# Patient Record
Sex: Male | Born: 1990 | Race: White | Hispanic: No | Marital: Single | Smoking: Current every day smoker
Health system: Southern US, Community
[De-identification: ages and names within clinical notes are randomized; demographics above are authoritative.]

## PROBLEM LIST (undated history)

## (undated) HISTORY — PX: MYRINGOTOMY: SUR874

## (undated) HISTORY — PX: EYE SURGERY: SHX253

---
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
## 2009-07-29 ENCOUNTER — Encounter (HOSPITAL_COMMUNITY)
Admission: RE | Admit: 2009-07-29 | Discharge: 2009-10-06 | Payer: Self-pay | Source: Home / Self Care | Admitting: Endocrinology

## 2009-08-09 ENCOUNTER — Encounter: Admission: RE | Admit: 2009-08-09 | Discharge: 2009-08-09 | Payer: Self-pay | Admitting: Endocrinology

## 2009-08-31 ENCOUNTER — Encounter: Admission: RE | Admit: 2009-08-31 | Discharge: 2009-08-31 | Payer: Self-pay | Admitting: Endocrinology

## 2009-08-31 ENCOUNTER — Other Ambulatory Visit: Admission: RE | Admit: 2009-08-31 | Discharge: 2009-08-31 | Payer: Self-pay | Admitting: Interventional Radiology

## 2010-05-19 ENCOUNTER — Emergency Department (HOSPITAL_COMMUNITY)
Admission: EM | Admit: 2010-05-19 | Discharge: 2010-05-19 | Payer: Self-pay | Source: Home / Self Care | Admitting: Emergency Medicine

## 2011-04-14 ENCOUNTER — Other Ambulatory Visit: Payer: Self-pay | Admitting: Endocrinology

## 2011-04-14 DIAGNOSIS — E041 Nontoxic single thyroid nodule: Secondary | ICD-10-CM

## 2011-04-26 ENCOUNTER — Ambulatory Visit
Admission: RE | Admit: 2011-04-26 | Discharge: 2011-04-26 | Disposition: A | Discharge status: Home / Self Care | Payer: BC Managed Care – PPO | Source: Ambulatory Visit | Attending: Endocrinology | Admitting: Endocrinology

## 2011-04-26 DIAGNOSIS — E041 Nontoxic single thyroid nodule: Secondary | ICD-10-CM

## 2011-09-14 ENCOUNTER — Emergency Department (HOSPITAL_COMMUNITY)
Admission: EM | Admit: 2011-09-14 | Discharge: 2011-09-14 | Disposition: A | Discharge status: Home / Self Care | Payer: BC Managed Care – PPO | Attending: Emergency Medicine | Admitting: Emergency Medicine

## 2011-09-14 ENCOUNTER — Encounter (HOSPITAL_COMMUNITY): Payer: Self-pay | Admitting: Emergency Medicine

## 2011-09-14 DIAGNOSIS — S61209A Unspecified open wound of unspecified finger without damage to nail, initial encounter: Secondary | ICD-10-CM | POA: Insufficient documentation

## 2011-09-14 DIAGNOSIS — M79609 Pain in unspecified limb: Secondary | ICD-10-CM | POA: Insufficient documentation

## 2011-09-14 DIAGNOSIS — W278XXA Contact with other nonpowered hand tool, initial encounter: Secondary | ICD-10-CM | POA: Insufficient documentation

## 2011-09-14 MED ORDER — TETANUS-DIPHTH-ACELL PERTUSSIS 5-2.5-18.5 LF-MCG/0.5 IM SUSP
0.5000 mL | Freq: Once | INTRAMUSCULAR | Status: AC
  Administered 2011-09-14: 0.5 mL via INTRAMUSCULAR

## 2011-09-14 MED ORDER — LIDOCAINE HCL (PF) 2 % IJ SOLN
INTRAMUSCULAR | Status: AC
  Administered 2011-09-14: 18:00:00
  Filled 2011-09-14: qty 10

## 2011-09-14 MED ORDER — TETANUS-DIPHTH-ACELL PERTUSSIS 5-2.5-18.5 LF-MCG/0.5 IM SUSP
INTRAMUSCULAR | Status: AC
  Filled 2011-09-14: qty 0.5

## 2011-09-14 NOTE — ED Notes (Signed)
Patient with c/o right first finger laceration. Patient reports cutting finger on vegetable slicer. Bleeding controlled. Laceration to tip of right first finger. Re-dressed in triage.

## 2011-09-14 NOTE — Discharge Instructions (Signed)
Finger Avulsion  When the tip of the finger is lost, a new nail may grow back if part of the fingernail is left. The new nail may be deformed. If just the tip of the finger is lost, no repair may be needed unless there is bone showing. If bone is showing, your caregiver may need to remove the protruding bone and put on a bandage. Your caregiver will do what is best for you. Most of the time when a fingertip is lost, the end will gradually grow back on and look fairly normal, but it may remain sensitive to pressure and temperature extremes for a long time. HOME CARE INSTRUCTIONS   Keep your hand elevated above your heart to relieve pain and swelling.   Keep your dressing dry and clean.   Change your bandage in 24 hours or as directed.   After your bandage is changed, soak your hand in warm soapy water for 10 to 15 minutes. Do this 3 times per day. This helps reduce pain and swelling.   After soaking your hand, apply a clean, dry bandage. Change your bandage if it is wet or dirty.   Only take over-the-counter or prescription medicines for pain, discomfort, or fever as directed by your caregiver.   See your caregiver as needed for problems.  SEEK MEDICAL CARE IF:   You have increased pain, swelling, drainage, or bleeding.   You have a fever.   You have swelling that spreads from your finger and into your hand.  Make sure to check to see if you need a tetanus booster. Document Released: 07/03/2001 Document Revised: 04/13/2011 Document Reviewed: 05/28/2008 Exit[employer] Patient Information 2012 [employer]. 

## 2011-09-14 NOTE — ED Notes (Signed)
Pt cut tip of right middle finger with a vegetable slicer today

## 2011-09-14 NOTE — ED Provider Notes (Signed)
History     CSN: [id number]  Arrival date & time 09/14/11  1714   First MD Initiated Contact with Patient 09/14/11 1720      Chief Complaint  Patient presents with  . Laceration    (Consider location/radiation/quality/duration/timing/severity/associated sxs/prior treatment) Patient is a 21 y.o. male presenting with skin laceration. The history is provided by the patient.  Laceration  The incident occurred less than 1 hour ago. The laceration is located on the right hand. Size: 0.5 cm diameter round avulsion to distal right third finger,  hemostatic. Injury mechanism: a vegetable slicer. The pain is at a severity of 4/10. The pain is moderate. The pain has been constant since onset. He reports no foreign bodies present. His tetanus status is out of date.    History reviewed. No pertinent past medical history.  Past Surgical History  Procedure Date  . Myringotomy     No family history on file.  History  Substance Use Topics  . Smoking status: Current Everyday Smoker -- 0.5 packs/day  . Smokeless tobacco: Not on file  . Alcohol Use: Yes     occasionally      Review of Systems  Constitutional: Negative for fever.  Musculoskeletal: Negative for joint swelling and arthralgias.  Skin: Positive for wound. Negative for rash.    Allergies  Review of patient's allergies indicates no known allergies.  Home Medications   Raspberry ketones otc diet supplement.    BP 117/75  Pulse 102  Temp(Src) 98.8 F (37.1 C) (Oral)  Resp 18  Ht 5\' 6"  (1.676 m)  Wt 200 lb (90.719 kg)  BMI 32.28 kg/m2  SpO2 99%  Physical Exam  Nursing note and vitals reviewed. Constitutional: He appears well-developed and well-nourished.  HENT:  Head: Normocephalic and atraumatic.  Cardiovascular: Normal rate.   Pulmonary/Chest: Effort normal.  Musculoskeletal: Normal range of motion.  Neurological: He is alert.  Skin: Skin is dry.       0.5 cm superficial avulsion to 3rd finger distal,   Right,  Hemostatic,  Nail plate not affected by injury.  Psychiatric: He has a normal mood and affect.    ED Course  Procedures (including critical care time)  Labs Reviewed - No data to display No results found.   1. Avulsion of skin of finger     Patient was given a digital block using 2% lidocaine without epi,  Using 3 cc after cleansing skin with wound cleaning spray.  Avulsion was then gently scrubbed using wound cleanser spray,  Saline,  Then quick clot was applied for hemostasis.  Wound was then dressed using xeroform and sterile bulky dressing.   Pt tolerated well.   MDM  Tetanus updated.  Pt advised to keep wound clean and dry for the next 48 hours,  Then wash with mild soap and water twice daily,  Keep covered and clean.  REturn here for any sign of infection.        Burgess Amor, PA 09/15/11 1258

## 2011-09-18 NOTE — ED Provider Notes (Signed)
Medical screening examination/treatment/procedure(s) were performed by non-physician practitioner and as supervising physician I was immediately available for consultation/collaboration.  Shelda Jakes, MD 09/18/11 [phone]
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
## 2012-01-26 ENCOUNTER — Other Ambulatory Visit (HOSPITAL_COMMUNITY): Payer: Self-pay | Admitting: Endocrinology

## 2012-01-26 DIAGNOSIS — E049 Nontoxic goiter, unspecified: Secondary | ICD-10-CM

## 2012-04-01 ENCOUNTER — Ambulatory Visit (HOSPITAL_COMMUNITY): Payer: BC Managed Care – PPO

## 2012-04-08 ENCOUNTER — Ambulatory Visit (HOSPITAL_COMMUNITY): Payer: BC Managed Care – PPO | Attending: Endocrinology

## 2013-12-31 ENCOUNTER — Encounter (INDEPENDENT_AMBULATORY_CARE_PROVIDER_SITE_OTHER): Payer: Self-pay | Admitting: *Deleted

## 2014-02-05 ENCOUNTER — Ambulatory Visit (INDEPENDENT_AMBULATORY_CARE_PROVIDER_SITE_OTHER): Payer: Self-pay | Admitting: Internal Medicine

## 2020-06-14 ENCOUNTER — Ambulatory Visit
Admission: EM | Admit: 2020-06-14 | Discharge: 2020-06-14 | Disposition: A | Discharge status: Home / Self Care | Payer: 59 | Attending: Emergency Medicine | Admitting: Emergency Medicine

## 2020-06-14 ENCOUNTER — Other Ambulatory Visit: Payer: Self-pay

## 2020-06-14 ENCOUNTER — Ambulatory Visit (INDEPENDENT_AMBULATORY_CARE_PROVIDER_SITE_OTHER): Payer: 59

## 2020-06-14 DIAGNOSIS — M79642 Pain in left hand: Secondary | ICD-10-CM | POA: Diagnosis not present

## 2020-06-14 DIAGNOSIS — M940 Chondrocostal junction syndrome [Tietze]: Secondary | ICD-10-CM | POA: Diagnosis not present

## 2020-06-14 DIAGNOSIS — R0789 Other chest pain: Secondary | ICD-10-CM | POA: Diagnosis not present

## 2020-06-14 MED ORDER — CYCLOBENZAPRINE HCL 10 MG PO TABS: 10.0000 mg | ORAL_TABLET | Freq: Every day | ORAL | 0 refills | Status: DC

## 2020-06-14 MED ORDER — NAPROXEN 500 MG PO TABS: 500.0000 mg | ORAL_TABLET | Freq: Two times a day (BID) | ORAL | 0 refills | Status: DC
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
## 2020-06-14 NOTE — Discharge Instructions (Addendum)
Rest, ice and heat as needed Ensure adequate range of motion as tolerated. Injuries all appear to be muscular in nature at this time Prescribed naproxen as needed for inflammation and pain relief.  DO NOT TAKE WITH OTHER antiinflammatories, as this may cause GI upset and/or bleed Prescribed flexeril as needed at bedtime for muscle spasm.  Do not drive or operate heavy machinery while taking this medication Expect some increased pain in the next 1-3 days.  It may take 3-4 weeks for complete resolution of symptoms Will f/u with his doctor or here if not seeing significant improvement within one week. Return here or go to ER if you have any new or worsening symptoms such as numbness/tingling of the inner thighs, loss of bladder or bowel control, headache/blurry vision, nausea/vomiting, confusion/altered mental status, dizziness, weakness, passing out, imbalance, etc... 
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
## 2020-06-24 ENCOUNTER — Other Ambulatory Visit: Payer: Self-pay

## 2020-06-24 ENCOUNTER — Encounter: Payer: Self-pay | Admitting: Internal Medicine

## 2020-06-24 ENCOUNTER — Ambulatory Visit: Payer: 59 | Admitting: Internal Medicine

## 2020-06-24 VITALS — BP 131/83 | HR 89 | Temp 98.8°F | Resp 18 | Ht 66.0 in | Wt 215.0 lb

## 2020-06-24 DIAGNOSIS — Z720 Tobacco use: Secondary | ICD-10-CM

## 2020-06-24 DIAGNOSIS — Z7689 Persons encountering health services in other specified circumstances: Secondary | ICD-10-CM

## 2020-06-24 DIAGNOSIS — M549 Dorsalgia, unspecified: Secondary | ICD-10-CM | POA: Diagnosis not present

## 2020-06-24 NOTE — Patient Instructions (Signed)
Please continue to take Flexeril as prescribed for muscle spasms. Okay to take Naproxen or Tylenol for back pain.  Please perform simple back exercises as instructed.  Please follow low salt diet and perform moderate exercise/walking at least 150 mins/week.

## 2020-06-26 LAB — CMP14+EGFR
ALT: 50 IU/L — ABNORMAL HIGH (ref 0–44)
AST: 30 IU/L (ref 0–40)
Albumin/Globulin Ratio: 2.2 (ref 1.2–2.2)
Albumin: 4.6 g/dL (ref 4.1–5.2)
Alkaline Phosphatase: 78 IU/L (ref 44–121)
BUN/Creatinine Ratio: 19 (ref 9–20)
BUN: 17 mg/dL (ref 6–20)
Bilirubin Total: 0.8 mg/dL (ref 0.0–1.2)
CO2: 18 mmol/L — ABNORMAL LOW (ref 20–29)
Calcium: 9.2 mg/dL (ref 8.7–10.2)
Chloride: 102 mmol/L (ref 96–106)
Creatinine, Ser: 0.9 mg/dL (ref 0.76–1.27)
GFR calc Af Amer: 132 mL/min/{1.73_m2} (ref >59)
GFR calc non Af Amer: 114 mL/min/{1.73_m2} (ref >59)
Globulin, Total: 2.1 g/dL (ref 1.5–4.5)
Glucose: 83 mg/dL (ref 65–99)
Potassium: 4.1 mmol/L (ref 3.5–5.2)
Sodium: 138 mmol/L (ref 134–144)
Total Protein: 6.7 g/dL (ref 6.0–8.5)

## 2020-06-26 LAB — LIPID PANEL
Chol/HDL Ratio: 4.1 ratio (ref 0.0–5.0)
Cholesterol, Total: 165 mg/dL (ref 100–199)
HDL: 40 mg/dL (ref >39)
LDL Chol Calc (NIH): 104 mg/dL — ABNORMAL HIGH (ref 0–99)
Triglycerides: 115 mg/dL (ref 0–149)
VLDL Cholesterol Cal: 21 mg/dL (ref 5–40)

## 2020-06-26 LAB — HEMOGLOBIN A1C
Est. average glucose Bld gHb Est-mCnc: 103 mg/dL
Hgb A1c MFr Bld: 5.2 % (ref 4.8–5.6)

## 2020-06-26 LAB — CBC
Hematocrit: 46.6 % (ref 37.5–51.0)
Hemoglobin: 15.9 g/dL (ref 13.0–17.7)
MCH: 32.9 pg (ref 26.6–33.0)
MCHC: 34.1 g/dL (ref 31.5–35.7)
MCV: 96 fL (ref 79–97)
Platelets: 202 10*3/uL (ref 150–450)
RBC: 4.84 x10E6/uL (ref 4.14–5.80)
RDW: 12.9 % (ref 11.6–15.4)
WBC: 4.9 10*3/uL (ref 3.4–10.8)

## 2020-06-26 LAB — VITAMIN D 25 HYDROXY (VIT D DEFICIENCY, FRACTURES): Vit D, 25-Hydroxy: 17.2 ng/mL — ABNORMAL LOW (ref 30.0–100.0)

## 2020-06-26 LAB — HIV ANTIBODY (ROUTINE TESTING W REFLEX): HIV Screen 4th Generation wRfx: NONREACTIVE

## 2020-06-26 LAB — TSH: TSH: 1.22 u[IU]/mL (ref 0.450–4.500)

## 2020-06-28 NOTE — Assessment & Plan Note (Signed)
Care established Previous chart reviewed History and medications reviewed with the patient 
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
## 2020-06-28 NOTE — Assessment & Plan Note (Signed)
Asked about quitting: confirms that he currently smokes cigarettes Advise to quit smoking: Educated about QUITTING to reduce the risk of cancer, cardio and cerebrovascular disease. Assess willingness: Unwilling to quit at this time, but is working on cutting back. Assist with counseling and pharmacotherapy: Counseled for 5 minutes and literature provided. Arrange for follow up: follow up in 3 months and continue to offer help. 
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
## 2020-06-29 ENCOUNTER — Telehealth: Payer: Self-pay

## 2020-06-29 NOTE — Telephone Encounter (Signed)
Please call the pt regarding lab results

## 2020-06-29 NOTE — Telephone Encounter (Signed)
Pt informed

## 2020-11-16 ENCOUNTER — Emergency Department (HOSPITAL_COMMUNITY)
Admission: EM | Admit: 2020-11-16 | Discharge: 2020-11-16 | Disposition: A | Discharge status: Home / Self Care | Payer: 59 | Attending: Emergency Medicine | Admitting: Emergency Medicine

## 2020-11-16 ENCOUNTER — Encounter (HOSPITAL_COMMUNITY): Payer: Self-pay | Admitting: Emergency Medicine

## 2020-11-16 ENCOUNTER — Other Ambulatory Visit: Payer: Self-pay

## 2020-11-16 DIAGNOSIS — L509 Urticaria, unspecified: Secondary | ICD-10-CM | POA: Diagnosis not present

## 2020-11-16 DIAGNOSIS — L299 Pruritus, unspecified: Secondary | ICD-10-CM | POA: Insufficient documentation

## 2020-11-16 DIAGNOSIS — Z5321 Procedure and treatment not carried out due to patient leaving prior to being seen by health care provider: Secondary | ICD-10-CM | POA: Insufficient documentation

## 2020-11-16 NOTE — ED Triage Notes (Signed)
Pt c/o hives and itching all over that started about 1 hour after taking amoxicillin.

## 2020-11-28 ENCOUNTER — Other Ambulatory Visit: Payer: Self-pay

## 2020-11-28 ENCOUNTER — Ambulatory Visit
Admission: EM | Admit: 2020-11-28 | Discharge: 2020-11-28 | Disposition: A | Discharge status: Home / Self Care | Payer: 59 | Attending: Physician Assistant | Admitting: Physician Assistant

## 2020-11-28 ENCOUNTER — Encounter: Payer: Self-pay | Admitting: Emergency Medicine

## 2020-11-28 DIAGNOSIS — R21 Rash and other nonspecific skin eruption: Secondary | ICD-10-CM | POA: Diagnosis not present

## 2020-11-28 MED ORDER — PREDNISONE 50 MG PO TABS: ORAL_TABLET | ORAL | 0 refills | Status: DC

## 2020-11-28 NOTE — ED Triage Notes (Signed)
Rash on legs and lower back since yesterday.  States rash itches.
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
## 2020-12-24 ENCOUNTER — Encounter: Payer: Self-pay | Admitting: Internal Medicine

## 2020-12-24 ENCOUNTER — Ambulatory Visit (INDEPENDENT_AMBULATORY_CARE_PROVIDER_SITE_OTHER): Payer: 59 | Admitting: Internal Medicine

## 2020-12-24 ENCOUNTER — Other Ambulatory Visit: Payer: Self-pay

## 2020-12-24 VITALS — BP 120/85 | HR 76 | Temp 98.5°F | Resp 20 | Ht 66.0 in | Wt 213.0 lb

## 2020-12-24 DIAGNOSIS — E559 Vitamin D deficiency, unspecified: Secondary | ICD-10-CM

## 2020-12-24 DIAGNOSIS — Z720 Tobacco use: Secondary | ICD-10-CM | POA: Diagnosis not present

## 2020-12-24 DIAGNOSIS — E782 Mixed hyperlipidemia: Secondary | ICD-10-CM
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
## 2020-12-24 NOTE — Assessment & Plan Note (Signed)
Last vitamin D Lab Results  Component Value Date   VD25OH 17.2 (L) 06/25/2020   Advised to take Vitamin D 2000 IU QD

## 2020-12-24 NOTE — Assessment & Plan Note (Signed)
Smokes about 0.5 pack/day at work, but less if not working (3 cigarettes/day)  Asked about quitting: confirms that he currently smokes cigarettes Advise to quit smoking: Educated about QUITTING to reduce the risk of cancer, cardio and cerebrovascular disease. Assess willingness: Unwilling to quit at this time, but is working on cutting back. Assist with counseling and pharmacotherapy: Counseled for 5 minutes and literature provided. Arrange for follow up: follow up in 3 months and continue to offer help.

## 2020-12-24 NOTE — Assessment & Plan Note (Signed)
Lipid profile reviewed Advised to follow low cholesterol diet 

## 2021-01-05 ENCOUNTER — Ambulatory Visit
Admission: EM | Admit: 2021-01-05 | Discharge: 2021-01-05 | Disposition: A | Discharge status: Home / Self Care | Payer: 59 | Attending: Family Medicine | Admitting: Family Medicine

## 2021-01-05 ENCOUNTER — Other Ambulatory Visit: Payer: Self-pay

## 2021-01-05 ENCOUNTER — Encounter: Payer: Self-pay | Admitting: Emergency Medicine

## 2021-01-05 DIAGNOSIS — L255 Unspecified contact dermatitis due to plants, except food: Secondary | ICD-10-CM | POA: Diagnosis not present

## 2021-01-05 MED ORDER — PREDNISONE 50 MG PO TABS: ORAL_TABLET | ORAL | 0 refills | Status: DC

## 2021-01-05 NOTE — ED Triage Notes (Signed)
Itchy rash that started on Saturday on arms and face
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
## 2021-04-17 ENCOUNTER — Other Ambulatory Visit: Payer: Self-pay

## 2021-04-17 ENCOUNTER — Ambulatory Visit
Admission: EM | Admit: 2021-04-17 | Discharge: 2021-04-17 | Disposition: A | Discharge status: Home / Self Care | Payer: 59 | Attending: Urgent Care | Admitting: Urgent Care

## 2021-04-17 ENCOUNTER — Encounter: Payer: Self-pay | Admitting: Emergency Medicine

## 2021-04-17 DIAGNOSIS — B349 Viral infection, unspecified: Secondary | ICD-10-CM | POA: Diagnosis not present

## 2021-04-17 DIAGNOSIS — Z20822 Contact with and (suspected) exposure to covid-19: Secondary | ICD-10-CM

## 2021-04-17 DIAGNOSIS — R052 Subacute cough: Secondary | ICD-10-CM

## 2021-04-17 DIAGNOSIS — R0981 Nasal congestion: Secondary | ICD-10-CM

## 2021-04-17 MED ORDER — PROMETHAZINE-DM 6.25-15 MG/5ML PO SYRP: 5.0000 mL | ORAL_SOLUTION | Freq: Every evening | ORAL | 0 refills | Status: DC | PRN

## 2021-04-17 MED ORDER — BENZONATATE 100 MG PO CAPS: 100.0000 mg | ORAL_CAPSULE | Freq: Three times a day (TID) | ORAL | 0 refills | Status: DC | PRN

## 2021-04-17 MED ORDER — PREDNISONE 50 MG PO TABS: 50.0000 mg | ORAL_TABLET | Freq: Every day | ORAL | 0 refills | Status: DC
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
## 2021-04-17 NOTE — Discharge Instructions (Addendum)
We will notify you of your test results as they arrive and may take between 48-72 hours.  I encourage you to sign up for MyChart if you have not already done so as this can be the easiest way for us to communicate results to you online or through a phone app.  Generally, we only contact you if it is a positive test result.  In the meantime, if you develop worsening symptoms including fever, chest pain, shortness of breath despite our current treatment plan then please report to the emergency room as this may be a sign of worsening status from possible viral infection.  Otherwise, we will manage this as a viral syndrome. For sore throat or cough try using a honey-based tea. Use 3 teaspoons of honey with juice squeezed from half lemon. Place shaved pieces of ginger into 1/2-1 cup of water and warm over stove top. Then mix the ingredients and repeat every 4 hours as needed. Please take Tylenol 500mg-650mg every 6 hours for aches and pains, fevers. Hydrate very well with at least 2 liters of water. Eat light meals such as soups to replenish electrolytes and soft fruits, veggies. Start an antihistamine like Zyrtec for postnasal drainage, sinus congestion.   

## 2021-04-17 NOTE — ED Triage Notes (Signed)
Nasal congestion and fatigue started last week.  Cough started 2 to 3 days ago.

## 2021-04-18 LAB — COVID-19, FLU A+B NAA
Influenza A, NAA: NOT DETECTED
Influenza B, NAA: NOT DETECTED
SARS-CoV-2, NAA: DETECTED — AB

## 2021-07-01 ENCOUNTER — Encounter: Payer: Self-pay | Admitting: Internal Medicine

## 2021-07-01 ENCOUNTER — Other Ambulatory Visit: Payer: Self-pay

## 2021-07-01 ENCOUNTER — Ambulatory Visit (INDEPENDENT_AMBULATORY_CARE_PROVIDER_SITE_OTHER): Payer: 59 | Admitting: Internal Medicine

## 2021-07-01 VITALS — BP 110/76 | HR 85 | Resp 16 | Ht 66.0 in | Wt 222.0 lb

## 2021-07-01 DIAGNOSIS — E559 Vitamin D deficiency, unspecified: Secondary | ICD-10-CM | POA: Diagnosis not present

## 2021-07-01 DIAGNOSIS — Z720 Tobacco use: Secondary | ICD-10-CM

## 2021-07-01 DIAGNOSIS — Z1159 Encounter for screening for other viral diseases: Secondary | ICD-10-CM | POA: Diagnosis not present

## 2021-07-01 DIAGNOSIS — Z0001 Encounter for general adult medical examination with abnormal findings: Secondary | ICD-10-CM | POA: Diagnosis not present

## 2021-07-01 DIAGNOSIS — E782 Mixed hyperlipidemia: Secondary | ICD-10-CM

## 2021-07-01 DIAGNOSIS — Z2821 Immunization not carried out because of patient refusal: Secondary | ICD-10-CM

## 2021-07-01 NOTE — Assessment & Plan Note (Signed)
Physical exam as documented. Fasting blood tests today. 

## 2021-07-01 NOTE — Assessment & Plan Note (Signed)
Last vitamin D Lab Results  Component Value Date   VD25OH 17.2 (L) 06/25/2020   Advised to take Vitamin D 2000 IU QD 
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
## 2021-07-01 NOTE — Progress Notes (Signed)
52

## 2021-07-01 NOTE — Assessment & Plan Note (Signed)
Quit smoking in 11/22

## 2021-07-01 NOTE — Assessment & Plan Note (Signed)
Check lipid profile Advised to follow low cholesterol diet 

## 2021-07-02 LAB — CBC WITH DIFFERENTIAL/PLATELET
Basophils Absolute: 0 10*3/uL (ref 0.0–0.2)
Basos: 0 %
EOS (ABSOLUTE): 0.1 10*3/uL (ref 0.0–0.4)
Eos: 2 %
Hematocrit: 45.4 % (ref 37.5–51.0)
Hemoglobin: 15.5 g/dL (ref 13.0–17.7)
Immature Grans (Abs): 0 10*3/uL (ref 0.0–0.1)
Immature Granulocytes: 0 %
Lymphocytes Absolute: 1.9 10*3/uL (ref 0.7–3.1)
Lymphs: 38 %
MCH: 32.3 pg (ref 26.6–33.0)
MCHC: 34.1 g/dL (ref 31.5–35.7)
MCV: 95 fL (ref 79–97)
Monocytes Absolute: 0.5 10*3/uL (ref 0.1–0.9)
Monocytes: 10 %
Neutrophils Absolute: 2.5 10*3/uL (ref 1.4–7.0)
Neutrophils: 50 %
Platelets: 205 10*3/uL (ref 150–450)
RBC: 4.8 x10E6/uL (ref 4.14–5.80)
RDW: 12.7 % (ref 11.6–15.4)
WBC: 5.1 10*3/uL (ref 3.4–10.8)

## 2021-07-02 LAB — LIPID PANEL
Chol/HDL Ratio: 4.5 ratio (ref 0.0–5.0)
Cholesterol, Total: 172 mg/dL (ref 100–199)
HDL: 38 mg/dL — ABNORMAL LOW (ref >39)
LDL Chol Calc (NIH): 110 mg/dL — ABNORMAL HIGH (ref 0–99)
Triglycerides: 132 mg/dL (ref 0–149)
VLDL Cholesterol Cal: 24 mg/dL (ref 5–40)

## 2021-07-02 LAB — CMP14+EGFR
ALT: 56 IU/L — ABNORMAL HIGH (ref 0–44)
AST: 31 IU/L (ref 0–40)
Albumin/Globulin Ratio: 1.8 (ref 1.2–2.2)
Albumin: 4.3 g/dL (ref 4.0–5.0)
Alkaline Phosphatase: 72 IU/L (ref 44–121)
BUN/Creatinine Ratio: 15 (ref 9–20)
BUN: 13 mg/dL (ref 6–20)
Bilirubin Total: 0.6 mg/dL (ref 0.0–1.2)
CO2: 23 mmol/L (ref 20–29)
Calcium: 9.5 mg/dL (ref 8.7–10.2)
Chloride: 105 mmol/L (ref 96–106)
Creatinine, Ser: 0.84 mg/dL (ref 0.76–1.27)
Globulin, Total: 2.4 g/dL (ref 1.5–4.5)
Glucose: 90 mg/dL (ref 70–99)
Potassium: 4.5 mmol/L (ref 3.5–5.2)
Sodium: 140 mmol/L (ref 134–144)
Total Protein: 6.7 g/dL (ref 6.0–8.5)
eGFR: 120 mL/min/{1.73_m2} (ref >59)

## 2021-07-02 LAB — HEPATITIS C ANTIBODY: Hep C Virus Ab: NONREACTIVE

## 2021-07-02 LAB — TSH: TSH: 0.801 u[IU]/mL (ref 0.450–4.500)

## 2021-07-02 LAB — VITAMIN D 25 HYDROXY (VIT D DEFICIENCY, FRACTURES): Vit D, 25-Hydroxy: 20.3 ng/mL — ABNORMAL LOW (ref 30.0–100.0)

## 2021-10-07 ENCOUNTER — Encounter: Payer: Self-pay | Admitting: Emergency Medicine

## 2021-10-07 ENCOUNTER — Ambulatory Visit
Admission: EM | Admit: 2021-10-07 | Discharge: 2021-10-07 | Disposition: A | Discharge status: Home / Self Care | Payer: 59

## 2021-10-07 DIAGNOSIS — J069 Acute upper respiratory infection, unspecified: Secondary | ICD-10-CM

## 2021-10-07 MED ORDER — PROMETHAZINE-DM 6.25-15 MG/5ML PO SYRP: 5.0000 mL | ORAL_SOLUTION | Freq: Every evening | ORAL | 0 refills | Status: DC | PRN

## 2021-10-07 MED ORDER — BENZONATATE 100 MG PO CAPS: 100.0000 mg | ORAL_CAPSULE | Freq: Three times a day (TID) | ORAL | 0 refills | Status: DC | PRN
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
## 2021-10-07 NOTE — ED Triage Notes (Signed)
Nasal congestion, productive cough that is clear.  Headache.  Symptoms x 2 days.  Has been taking zyrtec to treat symptoms.

## 2021-10-07 NOTE — Discharge Instructions (Addendum)
Your symptoms and exam findings are most consistent with a viral upper respiratory infection. These usually run their course in about 10 days.  If your symptoms last longer than 10 days without improvement, please follow up with your primary care provider.  If your symptoms, worsen, please go to the Emergency Room.    Some things that can make you feel better are: - Increased rest - Increasing fluid with water/sugar free electrolytes - Acetaminophen and ibuprofen as needed for fever/pain.  - Salt water gargling, chloraseptic spray and throat lozenges - OTC guaifenesin (Mucinex).  - Saline sinus flushes or a neti pot.  - Humidifying the air. - You can use Tessalon perles or cough syrup at night time for cough.  Please do not take these at the same time; try to space them apart by 1 hour; and do not drive or operate heavy machinery while taking these medications

## 2022-01-02 ENCOUNTER — Ambulatory Visit
Admission: EM | Admit: 2022-01-02 | Discharge: 2022-01-02 | Disposition: A | Discharge status: Home / Self Care | Payer: 59 | Attending: Nurse Practitioner | Admitting: Nurse Practitioner

## 2022-01-02 DIAGNOSIS — R197 Diarrhea, unspecified: Secondary | ICD-10-CM

## 2022-01-02 DIAGNOSIS — A084 Viral intestinal infection, unspecified: Secondary | ICD-10-CM | POA: Diagnosis not present

## 2022-01-02 DIAGNOSIS — R112 Nausea with vomiting, unspecified: Secondary | ICD-10-CM

## 2022-01-02 LAB — POCT URINALYSIS DIP (MANUAL ENTRY)
Bilirubin, UA: NEGATIVE
Blood, UA: NEGATIVE
Glucose, UA: NEGATIVE mg/dL
Ketones, POC UA: NEGATIVE mg/dL
Leukocytes, UA: NEGATIVE
Nitrite, UA: NEGATIVE
Protein Ur, POC: 30 mg/dL — AB
Spec Grav, UA: >=1.03 — AB (ref 1.010–1.025)
Urobilinogen, UA: 0.2 E.U./dL
pH, UA: 5.5 (ref 5.0–8.0)

## 2022-01-02 MED ORDER — LOPERAMIDE HCL 2 MG PO CAPS: 2.0000 mg | ORAL_CAPSULE | Freq: Four times a day (QID) | ORAL | 0 refills | Status: DC | PRN

## 2022-01-02 MED ORDER — ONDANSETRON 4 MG PO TBDP: 4.0000 mg | ORAL_TABLET | Freq: Three times a day (TID) | ORAL | 0 refills | Status: DC | PRN
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
## 2022-01-02 NOTE — ED Triage Notes (Signed)
Pt states he began having diarrhea since Saturday and began having vomiting this am

## 2022-01-02 NOTE — Discharge Instructions (Addendum)
Your urinalysis shows that you need to increase your water intake.  I understand that you are drinking Gatorade, but recommend switching to water.  Gatorade contains a high sugar content, which may be causing worsening of your symptoms.  Try to drink at least 10-12 eight ounce glasses of water daily while symptoms persist. Your lab work is pending.  If your test results are abnormal, you will be contacted.  If you have access to MyChart, you will also be able to see the results there. Take medication as prescribed. Eat more foods that increase the bulk in your stool.  This includes foods that contain or that are high in fiber.  You can refer to the information provided to you today during this visit.  Avoid eating greasy foods, spicy foods, or dairy products while symptoms persist. Allow for plenty of rest. If your symptoms do not improve or worsen within the next 24 to 48 hours, or if you develop fever, chills, abdominal pain, or nausea or vomiting that will not stop, please go to the emergency department immediately. Follow-up with your primary care if symptoms do not improve.

## 2022-01-03 LAB — CBC WITH DIFFERENTIAL/PLATELET
Basophils Absolute: 0 10*3/uL (ref 0.0–0.2)
Basos: 0 %
EOS (ABSOLUTE): 0 10*3/uL (ref 0.0–0.4)
Eos: 0 %
Hematocrit: 48.2 % (ref 37.5–51.0)
Hemoglobin: 16.9 g/dL (ref 13.0–17.7)
Immature Grans (Abs): 0 10*3/uL (ref 0.0–0.1)
Immature Granulocytes: 1 %
Lymphocytes Absolute: 1 10*3/uL (ref 0.7–3.1)
Lymphs: 21 %
MCH: 33.2 pg — ABNORMAL HIGH (ref 26.6–33.0)
MCHC: 35.1 g/dL (ref 31.5–35.7)
MCV: 95 fL (ref 79–97)
Monocytes Absolute: 0.6 10*3/uL (ref 0.1–0.9)
Monocytes: 12 %
Neutrophils Absolute: 3.1 10*3/uL (ref 1.4–7.0)
Neutrophils: 66 %
Platelets: 165 10*3/uL (ref 150–450)
RBC: 5.09 x10E6/uL (ref 4.14–5.80)
RDW: 13 % (ref 11.6–15.4)
WBC: 4.7 10*3/uL (ref 3.4–10.8)

## 2022-04-26 ENCOUNTER — Other Ambulatory Visit: Payer: Self-pay | Admitting: Nurse Practitioner

## 2022-04-26 NOTE — Telephone Encounter (Signed)
Unable to refill per protocol, last refill by another provider. Provider not at this practice. Will refuse.  Requested Prescriptions  Pending Prescriptions Disp Refills   promethazine-dextromethorphan (PROMETHAZINE-DM) 6.25-15 MG/5ML syrup [Pharmacy Med Name: PROMETHAZINE DM SYRUP] 118 mL 0    Sig: TAKE 5 MLS BY MOUTH AT BEDTIME AS NEEDED FOR COUGH. DO NOT TAKE WHILE DRIVING OR OPERATING HEAVY MACHINERY.     Ear, Nose, and Throat:  Antitussives/Expectorants Failed - 04/26/2022  8:43 AM      Failed - Valid encounter within last 12 months    Recent Outpatient Visits   None     Future Appointments             In 2 months Anabel Halon, MD [facility]

## 2022-07-07 ENCOUNTER — Encounter: Payer: 59 | Admitting: Internal Medicine

## 2022-07-07 ENCOUNTER — Encounter: Payer: Self-pay | Admitting: Internal Medicine

## 2023-07-31 ENCOUNTER — Encounter: Payer: Self-pay | Admitting: Emergency Medicine

## 2023-07-31 ENCOUNTER — Ambulatory Visit
Admission: EM | Admit: 2023-07-31 | Discharge: 2023-07-31 | Disposition: A | Discharge status: Home / Self Care | Attending: Nurse Practitioner | Admitting: Nurse Practitioner

## 2023-07-31 DIAGNOSIS — J069 Acute upper respiratory infection, unspecified: Secondary | ICD-10-CM | POA: Diagnosis not present

## 2023-07-31 LAB — POC COVID19/FLU A&B COMBO
Covid Antigen, POC: NEGATIVE
Influenza A Antigen, POC: NEGATIVE
Influenza B Antigen, POC: NEGATIVE

## 2023-07-31 MED ORDER — FLUTICASONE PROPIONATE 50 MCG/ACT NA SUSP: 2.0000 | Freq: Every day | NASAL | 0 refills | Status: DC

## 2023-07-31 MED ORDER — PSEUDOEPH-BROMPHEN-DM 30-2-10 MG/5ML PO SYRP: 5.0000 mL | ORAL_SOLUTION | Freq: Four times a day (QID) | ORAL | 0 refills | Status: DC | PRN

## 2023-07-31 NOTE — Discharge Instructions (Signed)
 Take medication as directed. Continue your current allergy medication regimen. Increase fluids and get plenty of rest. May take over-the-counter Ibuprofen or Tylenol as needed for pain, fever, or general discomfort. Recommend normal saline nasal spray to help with nasal congestion throughout the day. Warm salt water gargles 3-4 times daily as needed for throat pain or discomfort.  You may also use over-the-counter Chloraseptic throat spray or throat lozenges along with warm tea with honey and lemon for throat pain. It may be helpful to use a humidifier in your bedroom at nighttime during sleep and to sleep elevated on pillows while symptoms persist. As discussed, if symptoms or not improving over the next 7 to 10 days, or appear to be worsening, you may follow-up in this clinic or with your primary care physician for further evaluation. Follow-up as needed.

## 2023-07-31 NOTE — ED Triage Notes (Signed)
 Nasal congestion and cough started on Sunday.  Headache started yesterday.  Has been taking mucinex with some relief.
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
## 2024-02-11 ENCOUNTER — Ambulatory Visit: Payer: Self-pay

## 2024-02-11 NOTE — Telephone Encounter (Signed)
 Copied from CRM [id number]. Topic: Clinical - Red Word Triage >> Feb 11, 2024 12:34 PM Winona R wrote: Decision tree denial-Glucose dropped to 70 last week and shot up to 200 shortly after eating.Pt would like to schedule an appointment  Pt stated he only wanted a Hgb A1C done. No other issues.   FYI Only or Action Required?: Action required by provider: appt.  Patient was last seen in primary care on 07/01/2021 by Tobie Suzzane POUR, MD.  Called Nurse Triage reporting Advice Only.  Symptoms began n/a.  Interventions attempted: Other: n/a.  Symptoms are: stable.  Triage Disposition: Call PCP When Office is Open (overriding See PCP Within 2 Weeks)  Pt requested appt in Jan - appt made. Patient/caregiver understands and will follow disposition?: Yes Reason for Disposition  Requesting regular office appointment    Lab appt  Answer Assessment - Initial Assessment Questions 1. REASON FOR CALL: What is the main reason for your call? or How can I best help you?     Requesting lab  Protocols used: Information Only Call - No Triage-A-AH

## 2024-05-19 ENCOUNTER — Encounter: Payer: Self-pay | Admitting: Internal Medicine

## 2024-05-19 ENCOUNTER — Ambulatory Visit (INDEPENDENT_AMBULATORY_CARE_PROVIDER_SITE_OTHER): Payer: Self-pay | Admitting: Internal Medicine

## 2024-05-19 VITALS — BP 116/72 | HR 94 | Ht 66.0 in | Wt 221.0 lb

## 2024-05-19 DIAGNOSIS — R739 Hyperglycemia, unspecified: Secondary | ICD-10-CM

## 2024-05-19 DIAGNOSIS — R42 Dizziness and giddiness: Secondary | ICD-10-CM | POA: Insufficient documentation

## 2024-05-19 DIAGNOSIS — E559 Vitamin D deficiency, unspecified: Secondary | ICD-10-CM | POA: Diagnosis not present

## 2024-05-19 DIAGNOSIS — Z23 Encounter for immunization: Secondary | ICD-10-CM | POA: Diagnosis not present

## 2024-05-19 DIAGNOSIS — Z0001 Encounter for general adult medical examination with abnormal findings: Secondary | ICD-10-CM

## 2024-05-19 DIAGNOSIS — E782 Mixed hyperlipidemia: Secondary | ICD-10-CM

## 2024-05-19 NOTE — Assessment & Plan Note (Signed)
 Likely related to hypoglycemia Check CMP and HbA1c

## 2024-05-19 NOTE — Assessment & Plan Note (Signed)
 Physical exam as documented. Fasting blood tests today. Tdap vaccine today.

## 2024-05-19 NOTE — Patient Instructions (Signed)
 Please continue to take medications as prescribed.  Please continue to follow heart healthy diet and perform moderate exercise/walking at least 150 mins/week.

## 2024-05-19 NOTE — Assessment & Plan Note (Signed)
 Last vitamin D  Lab Results  Component Value Date   VD25OH 20.3 (L) 07/01/2021   Advised to take Vitamin D  2000 IU QD

## 2024-05-19 NOTE — Assessment & Plan Note (Signed)
Check lipid profile Advised to follow low cholesterol diet 
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
## 2024-05-20 ENCOUNTER — Ambulatory Visit: Payer: Self-pay | Admitting: Internal Medicine

## 2024-05-20 LAB — CMP14+EGFR
ALT: 64 IU/L — ABNORMAL HIGH (ref 0–44)
AST: 29 IU/L (ref 0–40)
Albumin: 4.5 g/dL (ref 4.1–5.1)
Alkaline Phosphatase: 73 IU/L (ref 47–123)
BUN/Creatinine Ratio: 18 (ref 9–20)
BUN: 14 mg/dL (ref 6–20)
Bilirubin Total: 0.3 mg/dL (ref 0.0–1.2)
CO2: 22 mmol/L (ref 20–29)
Calcium: 9.2 mg/dL (ref 8.7–10.2)
Chloride: 105 mmol/L (ref 96–106)
Creatinine, Ser: 0.79 mg/dL (ref 0.76–1.27)
Globulin, Total: 2.5 g/dL (ref 1.5–4.5)
Glucose: 93 mg/dL (ref 70–99)
Potassium: 4.8 mmol/L (ref 3.5–5.2)
Sodium: 141 mmol/L (ref 134–144)
Total Protein: 7 g/dL (ref 6.0–8.5)
eGFR: 120 mL/min/1.73

## 2024-05-20 LAB — HEMOGLOBIN A1C
Est. average glucose Bld gHb Est-mCnc: 103 mg/dL
Hgb A1c MFr Bld: 5.2 % (ref 4.8–5.6)

## 2024-05-20 LAB — CBC WITH DIFFERENTIAL/PLATELET
Basophils Absolute: 0 x10E3/uL (ref 0.0–0.2)
Basos: 0 %
EOS (ABSOLUTE): 0.1 x10E3/uL (ref 0.0–0.4)
Eos: 1 %
Hematocrit: 48.4 % (ref 37.5–51.0)
Hemoglobin: 16.3 g/dL (ref 13.0–17.7)
Immature Grans (Abs): 0 x10E3/uL (ref 0.0–0.1)
Immature Granulocytes: 0 %
Lymphocytes Absolute: 1.5 x10E3/uL (ref 0.7–3.1)
Lymphs: 31 %
MCH: 33 pg (ref 26.6–33.0)
MCHC: 33.7 g/dL (ref 31.5–35.7)
MCV: 98 fL — ABNORMAL HIGH (ref 79–97)
Monocytes Absolute: 0.8 x10E3/uL (ref 0.1–0.9)
Monocytes: 16 %
Neutrophils Absolute: 2.6 x10E3/uL (ref 1.4–7.0)
Neutrophils: 52 %
Platelets: 196 x10E3/uL (ref 150–450)
RBC: 4.94 x10E6/uL (ref 4.14–5.80)
RDW: 12.7 % (ref 11.6–15.4)
WBC: 4.9 x10E3/uL (ref 3.4–10.8)

## 2024-05-20 LAB — LIPID PANEL
Chol/HDL Ratio: 4.8 ratio (ref 0.0–5.0)
Cholesterol, Total: 153 mg/dL (ref 100–199)
HDL: 32 mg/dL — ABNORMAL LOW
LDL Chol Calc (NIH): 100 mg/dL — ABNORMAL HIGH (ref 0–99)
Triglycerides: 113 mg/dL (ref 0–149)
VLDL Cholesterol Cal: 21 mg/dL (ref 5–40)

## 2024-05-20 LAB — VITAMIN D 25 HYDROXY (VIT D DEFICIENCY, FRACTURES): Vit D, 25-Hydroxy: 18.4 ng/mL — ABNORMAL LOW (ref 30.0–100.0)

## 2024-05-20 LAB — TSH: TSH: 2.64 u[IU]/mL (ref 0.450–4.500)

## 2025-05-25 ENCOUNTER — Encounter: Payer: Self-pay | Admitting: Internal Medicine
